# Patient Record
Sex: Female | Born: 1990 | Race: Black or African American | Hispanic: No | Marital: Single | Smoking: Never smoker
Health system: Southern US, Community
[De-identification: ages and names within clinical notes are randomized; demographics above are authoritative.]

---

## 2010-01-24 ENCOUNTER — Emergency Department (HOSPITAL_COMMUNITY): Admission: EM | Admit: 2010-01-24 | Discharge: 2010-01-24 | Payer: Self-pay | Admitting: Emergency Medicine

## 2010-06-08 ENCOUNTER — Emergency Department (HOSPITAL_COMMUNITY): Admission: EM | Admit: 2010-06-08 | Discharge: 2010-06-08 | Payer: Self-pay | Admitting: Emergency Medicine

## 2011-08-22 ENCOUNTER — Emergency Department (HOSPITAL_COMMUNITY)
Admission: EM | Admit: 2011-08-22 | Discharge: 2011-08-22 | Disposition: A | Discharge status: Home / Self Care | Payer: Worker's Compensation | Attending: Emergency Medicine | Admitting: Emergency Medicine

## 2011-08-22 ENCOUNTER — Encounter: Payer: Self-pay | Admitting: Adult Health

## 2011-08-22 DIAGNOSIS — T148XXA Other injury of unspecified body region, initial encounter: Secondary | ICD-10-CM

## 2011-08-22 DIAGNOSIS — M545 Low back pain, unspecified: Secondary | ICD-10-CM | POA: Insufficient documentation

## 2011-08-22 DIAGNOSIS — S335XXA Sprain of ligaments of lumbar spine, initial encounter: Secondary | ICD-10-CM | POA: Insufficient documentation

## 2011-08-22 MED ORDER — METHOCARBAMOL 500 MG PO TABS: 500.0000 mg | ORAL_TABLET | Freq: Two times a day (BID) | ORAL | Status: AC

## 2011-08-22 MED ORDER — IBUPROFEN 800 MG PO TABS: 800.0000 mg | ORAL_TABLET | Freq: Three times a day (TID) | ORAL | Status: AC
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
## 2011-08-25 NOTE — ED Provider Notes (Signed)
Medical screening examination/treatment/procedure(s) were performed by non-physician practitioner and as supervising physician I was immediately available for consultation/collaboration.   Suzi Roots, MD 08/25/11 [phone]

## 2011-10-07 ENCOUNTER — Emergency Department (HOSPITAL_COMMUNITY)
Admission: EM | Admit: 2011-10-07 | Discharge: 2011-10-07 | Disposition: A | Discharge status: Home / Self Care | Payer: BC Managed Care – HMO | Attending: Emergency Medicine | Admitting: Emergency Medicine

## 2011-10-07 ENCOUNTER — Emergency Department (HOSPITAL_COMMUNITY): Payer: BC Managed Care – HMO

## 2011-10-07 DIAGNOSIS — M25569 Pain in unspecified knee: Secondary | ICD-10-CM | POA: Insufficient documentation

## 2011-10-07 DIAGNOSIS — Y92410 Unspecified street and highway as the place of occurrence of the external cause: Secondary | ICD-10-CM | POA: Insufficient documentation

## 2011-10-07 DIAGNOSIS — M25539 Pain in unspecified wrist: Secondary | ICD-10-CM | POA: Insufficient documentation

## 2011-10-07 MED ORDER — CYCLOBENZAPRINE HCL 5 MG PO TABS: 5.0000 mg | ORAL_TABLET | Freq: Three times a day (TID) | ORAL | Status: DC | PRN

## 2011-10-07 MED ORDER — IBUPROFEN 800 MG PO TABS
800.0000 mg | ORAL_TABLET | Freq: Once | ORAL | Status: AC
  Administered 2011-10-07: 800 mg via ORAL
  Filled 2011-10-07: qty 1

## 2011-10-07 MED ORDER — HYDROCODONE-ACETAMINOPHEN 5-325 MG PO TABS: 2.0000 | ORAL_TABLET | ORAL | Status: AC | PRN

## 2011-10-07 MED ORDER — CYCLOBENZAPRINE HCL 5 MG PO TABS: 5.0000 mg | ORAL_TABLET | Freq: Three times a day (TID) | ORAL | Status: AC | PRN
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
## 2011-10-11 NOTE — ED Provider Notes (Signed)
History/physical exam/procedure(s) were performed by non-physician practitioner and as supervising physician I was immediately available for consultation/collaboration. I have reviewed all notes and am in agreement with care and plan.   Waynetta Metheny S Donis Pinder, MD 10/11/11 0725 

## 2012-12-15 ENCOUNTER — Other Ambulatory Visit (HOSPITAL_COMMUNITY)
Admission: RE | Admit: 2012-12-15 | Discharge: 2012-12-15 | Disposition: A | Discharge status: Home / Self Care | Payer: BC Managed Care – HMO | Source: Ambulatory Visit | Attending: Family Medicine | Admitting: Family Medicine

## 2012-12-15 DIAGNOSIS — Z124 Encounter for screening for malignant neoplasm of cervix: Secondary | ICD-10-CM | POA: Insufficient documentation

## 2012-12-15 DIAGNOSIS — Z113 Encounter for screening for infections with a predominantly sexual mode of transmission: Secondary | ICD-10-CM | POA: Insufficient documentation
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
## 2013-12-20 ENCOUNTER — Other Ambulatory Visit (HOSPITAL_COMMUNITY)
Admission: RE | Admit: 2013-12-20 | Discharge: 2013-12-20 | Disposition: A | Discharge status: Home / Self Care | Payer: BC Managed Care – HMO | Source: Ambulatory Visit | Attending: Family Medicine | Admitting: Family Medicine

## 2013-12-20 DIAGNOSIS — Z113 Encounter for screening for infections with a predominantly sexual mode of transmission: Secondary | ICD-10-CM | POA: Insufficient documentation

## 2013-12-20 DIAGNOSIS — Z124 Encounter for screening for malignant neoplasm of cervix: Secondary | ICD-10-CM | POA: Insufficient documentation

## 2013-12-20 DIAGNOSIS — N760 Acute vaginitis: Secondary | ICD-10-CM | POA: Insufficient documentation

## 2014-06-03 ENCOUNTER — Ambulatory Visit: Payer: BC Managed Care – PPO | Attending: Family Medicine | Admitting: Physical Therapy

## 2014-06-03 DIAGNOSIS — M546 Pain in thoracic spine: Secondary | ICD-10-CM | POA: Diagnosis not present

## 2014-06-03 DIAGNOSIS — S161XXD Strain of muscle, fascia and tendon at neck level, subsequent encounter: Secondary | ICD-10-CM | POA: Diagnosis not present

## 2014-06-03 DIAGNOSIS — M542 Cervicalgia: Secondary | ICD-10-CM | POA: Insufficient documentation

## 2014-06-03 DIAGNOSIS — Z5189 Encounter for other specified aftercare: Secondary | ICD-10-CM | POA: Diagnosis not present

## 2014-06-05 ENCOUNTER — Ambulatory Visit: Payer: BC Managed Care – PPO | Admitting: Physical Therapy

## 2014-06-05 DIAGNOSIS — Z5189 Encounter for other specified aftercare: Secondary | ICD-10-CM | POA: Diagnosis not present

## 2014-06-10 ENCOUNTER — Ambulatory Visit: Payer: BC Managed Care – PPO | Admitting: Physical Therapy

## 2014-06-10 ENCOUNTER — Ambulatory Visit: Payer: No Typology Code available for payment source

## 2014-06-11 ENCOUNTER — Ambulatory Visit: Payer: BC Managed Care – PPO | Admitting: Physical Therapy

## 2014-06-11 DIAGNOSIS — Z5189 Encounter for other specified aftercare: Secondary | ICD-10-CM | POA: Diagnosis not present

## 2014-06-12 ENCOUNTER — Ambulatory Visit: Payer: BC Managed Care – PPO | Admitting: Physical Therapy

## 2014-06-12 DIAGNOSIS — Z5189 Encounter for other specified aftercare: Secondary | ICD-10-CM | POA: Diagnosis not present

## 2014-06-17 ENCOUNTER — Ambulatory Visit: Payer: BC Managed Care – PPO | Admitting: Physical Therapy

## 2014-06-17 DIAGNOSIS — Z5189 Encounter for other specified aftercare: Secondary | ICD-10-CM | POA: Diagnosis not present

## 2014-06-18 ENCOUNTER — Ambulatory Visit: Payer: BC Managed Care – PPO

## 2014-06-18 DIAGNOSIS — Z5189 Encounter for other specified aftercare: Secondary | ICD-10-CM | POA: Diagnosis not present

## 2014-06-19 ENCOUNTER — Encounter: Payer: Self-pay | Admitting: Physical Therapy

## 2014-06-25 ENCOUNTER — Ambulatory Visit: Payer: BC Managed Care – PPO

## 2014-07-01 ENCOUNTER — Ambulatory Visit: Payer: BC Managed Care – PPO | Attending: Family Medicine | Admitting: Physical Therapy

## 2014-07-01 DIAGNOSIS — Z5189 Encounter for other specified aftercare: Secondary | ICD-10-CM | POA: Diagnosis present

## 2014-07-01 DIAGNOSIS — M542 Cervicalgia: Secondary | ICD-10-CM | POA: Insufficient documentation

## 2014-07-01 DIAGNOSIS — S161XXD Strain of muscle, fascia and tendon at neck level, subsequent encounter: Secondary | ICD-10-CM | POA: Diagnosis not present

## 2014-07-01 DIAGNOSIS — M546 Pain in thoracic spine: Secondary | ICD-10-CM | POA: Insufficient documentation

## 2014-07-03 ENCOUNTER — Ambulatory Visit: Payer: BC Managed Care – PPO | Admitting: Physical Therapy

## 2014-07-03 DIAGNOSIS — Z5189 Encounter for other specified aftercare: Secondary | ICD-10-CM | POA: Diagnosis not present

## 2014-07-08 ENCOUNTER — Ambulatory Visit: Payer: BC Managed Care – PPO | Admitting: Physical Therapy

## 2014-07-10 ENCOUNTER — Ambulatory Visit: Payer: BC Managed Care – PPO | Admitting: Physical Therapy

## 2014-07-10 DIAGNOSIS — Z5189 Encounter for other specified aftercare: Secondary | ICD-10-CM | POA: Diagnosis not present

## 2014-07-16 ENCOUNTER — Ambulatory Visit: Payer: BC Managed Care – PPO | Admitting: Physical Therapy

## 2014-08-01 ENCOUNTER — Encounter (HOSPITAL_COMMUNITY): Payer: Self-pay | Admitting: Emergency Medicine

## 2014-08-01 ENCOUNTER — Emergency Department (HOSPITAL_COMMUNITY): Payer: Worker's Compensation

## 2014-08-01 ENCOUNTER — Emergency Department (HOSPITAL_COMMUNITY)
Admission: EM | Admit: 2014-08-01 | Discharge: 2014-08-01 | Disposition: A | Discharge status: Home / Self Care | Payer: Worker's Compensation | Attending: Emergency Medicine | Admitting: Emergency Medicine

## 2014-08-01 DIAGNOSIS — Y9289 Other specified places as the place of occurrence of the external cause: Secondary | ICD-10-CM | POA: Insufficient documentation

## 2014-08-01 DIAGNOSIS — Z3202 Encounter for pregnancy test, result negative: Secondary | ICD-10-CM | POA: Diagnosis not present

## 2014-08-01 DIAGNOSIS — W228XXA Striking against or struck by other objects, initial encounter: Secondary | ICD-10-CM | POA: Insufficient documentation

## 2014-08-01 DIAGNOSIS — R52 Pain, unspecified: Secondary | ICD-10-CM

## 2014-08-01 DIAGNOSIS — Y9389 Activity, other specified: Secondary | ICD-10-CM | POA: Diagnosis not present

## 2014-08-01 DIAGNOSIS — Y998 Other external cause status: Secondary | ICD-10-CM | POA: Diagnosis not present

## 2014-08-01 DIAGNOSIS — S8002XA Contusion of left knee, initial encounter: Secondary | ICD-10-CM | POA: Diagnosis not present

## 2014-08-01 DIAGNOSIS — S9032XA Contusion of left foot, initial encounter: Secondary | ICD-10-CM | POA: Diagnosis not present

## 2014-08-01 DIAGNOSIS — Z79899 Other long term (current) drug therapy: Secondary | ICD-10-CM | POA: Insufficient documentation

## 2014-08-01 DIAGNOSIS — W19XXXA Unspecified fall, initial encounter: Secondary | ICD-10-CM

## 2014-08-01 DIAGNOSIS — S99922A Unspecified injury of left foot, initial encounter: Secondary | ICD-10-CM | POA: Diagnosis present

## 2014-08-01 DIAGNOSIS — R609 Edema, unspecified: Secondary | ICD-10-CM

## 2014-08-01 LAB — POC URINE PREG, ED: PREG TEST UR: NEGATIVE

## 2014-08-01 MED ORDER — IBUPROFEN 800 MG PO TABS
800.0000 mg | ORAL_TABLET | Freq: Once | ORAL | Status: AC
  Administered 2014-08-01: 800 mg via ORAL
  Filled 2014-08-01: qty 1

## 2014-08-01 NOTE — ED Notes (Signed)
Pt states she does not have to urinate at this time ?
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
## 2014-08-01 NOTE — ED Notes (Signed)
Pt in radiology.  Notified them that pt is going back to room E41.
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
